# Patient Record
Sex: Female | Born: 1985
Health system: Southern US, Community
[De-identification: ages and names within clinical notes are randomized; demographics above are authoritative.]

## PROBLEM LIST (undated history)

## (undated) HISTORY — PX: BREAST BIOPSY: SHX20

---

## 2013-01-21 ENCOUNTER — Other Ambulatory Visit (HOSPITAL_COMMUNITY)
Admission: RE | Admit: 2013-01-21 | Discharge: 2013-01-21 | Disposition: A | Discharge status: Home / Self Care | Payer: BC Managed Care – PPO | Source: Ambulatory Visit | Attending: Obstetrics and Gynecology | Admitting: Obstetrics and Gynecology

## 2013-01-21 DIAGNOSIS — Z01419 Encounter for gynecological examination (general) (routine) without abnormal findings: Secondary | ICD-10-CM | POA: Insufficient documentation

## 2013-01-21 DIAGNOSIS — Z113 Encounter for screening for infections with a predominantly sexual mode of transmission: Secondary | ICD-10-CM | POA: Insufficient documentation

## 2013-01-21 DIAGNOSIS — N760 Acute vaginitis: Secondary | ICD-10-CM | POA: Insufficient documentation

## 2013-09-24 ENCOUNTER — Other Ambulatory Visit: Payer: Self-pay | Admitting: Obstetrics and Gynecology

## 2013-09-24 DIAGNOSIS — N644 Mastodynia: Secondary | ICD-10-CM

## 2013-09-24 DIAGNOSIS — N63 Unspecified lump in unspecified breast: Secondary | ICD-10-CM

## 2013-10-01 ENCOUNTER — Other Ambulatory Visit: Payer: Self-pay | Admitting: Obstetrics and Gynecology

## 2013-10-01 ENCOUNTER — Ambulatory Visit
Admission: RE | Admit: 2013-10-01 | Discharge: 2013-10-01 | Disposition: A | Discharge status: Home / Self Care | Payer: BC Managed Care – PPO | Source: Ambulatory Visit | Attending: Obstetrics and Gynecology | Admitting: Obstetrics and Gynecology

## 2013-10-01 DIAGNOSIS — N644 Mastodynia: Secondary | ICD-10-CM

## 2013-10-01 DIAGNOSIS — N63 Unspecified lump in unspecified breast: Secondary | ICD-10-CM

## 2013-11-03 ENCOUNTER — Other Ambulatory Visit: Payer: BC Managed Care – PPO

## 2013-11-05 ENCOUNTER — Other Ambulatory Visit: Payer: Self-pay | Admitting: Obstetrics and Gynecology

## 2013-11-05 ENCOUNTER — Ambulatory Visit
Admission: RE | Admit: 2013-11-05 | Discharge: 2013-11-05 | Disposition: A | Discharge status: Home / Self Care | Payer: BC Managed Care – PPO | Source: Ambulatory Visit | Attending: Obstetrics and Gynecology | Admitting: Obstetrics and Gynecology

## 2013-11-05 DIAGNOSIS — N644 Mastodynia: Secondary | ICD-10-CM

## 2013-11-05 DIAGNOSIS — N63 Unspecified lump in unspecified breast: Secondary | ICD-10-CM

## 2015-11-09 ENCOUNTER — Other Ambulatory Visit (HOSPITAL_COMMUNITY)
Admission: RE | Admit: 2015-11-09 | Discharge: 2015-11-09 | Disposition: A | Discharge status: Home / Self Care | Payer: BLUE CROSS/BLUE SHIELD | Source: Ambulatory Visit | Attending: Obstetrics and Gynecology | Admitting: Obstetrics and Gynecology

## 2015-11-09 ENCOUNTER — Other Ambulatory Visit: Payer: Self-pay | Admitting: Obstetrics and Gynecology

## 2015-11-09 DIAGNOSIS — Z01419 Encounter for gynecological examination (general) (routine) without abnormal findings: Secondary | ICD-10-CM | POA: Diagnosis present

## 2015-11-09 DIAGNOSIS — Z1151 Encounter for screening for human papillomavirus (HPV): Secondary | ICD-10-CM | POA: Diagnosis present

## 2015-11-10 ENCOUNTER — Other Ambulatory Visit: Payer: Self-pay | Admitting: Obstetrics and Gynecology

## 2015-11-10 DIAGNOSIS — N6315 Unspecified lump in the right breast, overlapping quadrants: Secondary | ICD-10-CM

## 2015-11-10 DIAGNOSIS — N6310 Unspecified lump in the right breast, unspecified quadrant: Principal | ICD-10-CM

## 2015-11-13 LAB — CYTOLOGY - PAP

## 2015-11-14 ENCOUNTER — Other Ambulatory Visit: Payer: Self-pay

## 2015-11-15 ENCOUNTER — Ambulatory Visit
Admission: RE | Admit: 2015-11-15 | Discharge: 2015-11-15 | Disposition: A | Discharge status: Home / Self Care | Payer: BLUE CROSS/BLUE SHIELD | Source: Ambulatory Visit | Attending: Obstetrics and Gynecology | Admitting: Obstetrics and Gynecology

## 2015-11-15 DIAGNOSIS — N6315 Unspecified lump in the right breast, overlapping quadrants: Secondary | ICD-10-CM

## 2015-11-15 DIAGNOSIS — N6310 Unspecified lump in the right breast, unspecified quadrant: Principal | ICD-10-CM

## 2018-10-28 ENCOUNTER — Other Ambulatory Visit: Payer: Self-pay | Admitting: Obstetrics and Gynecology

## 2018-10-28 DIAGNOSIS — Z803 Family history of malignant neoplasm of breast: Secondary | ICD-10-CM | POA: Diagnosis not present

## 2018-10-28 DIAGNOSIS — N926 Irregular menstruation, unspecified: Secondary | ICD-10-CM | POA: Diagnosis not present

## 2018-10-28 DIAGNOSIS — Z113 Encounter for screening for infections with a predominantly sexual mode of transmission: Secondary | ICD-10-CM | POA: Diagnosis not present

## 2018-10-28 DIAGNOSIS — Z01419 Encounter for gynecological examination (general) (routine) without abnormal findings: Secondary | ICD-10-CM | POA: Diagnosis not present

## 2018-10-28 DIAGNOSIS — N6320 Unspecified lump in the left breast, unspecified quadrant: Secondary | ICD-10-CM

## 2018-10-30 ENCOUNTER — Ambulatory Visit
Admission: RE | Admit: 2018-10-30 | Discharge: 2018-10-30 | Disposition: A | Discharge status: Home / Self Care | Payer: BLUE CROSS/BLUE SHIELD | Source: Ambulatory Visit | Attending: Obstetrics and Gynecology | Admitting: Obstetrics and Gynecology

## 2018-10-30 DIAGNOSIS — N6320 Unspecified lump in the left breast, unspecified quadrant: Secondary | ICD-10-CM

## 2018-10-30 DIAGNOSIS — R922 Inconclusive mammogram: Secondary | ICD-10-CM | POA: Diagnosis not present

## 2018-10-30 DIAGNOSIS — N6002 Solitary cyst of left breast: Secondary | ICD-10-CM | POA: Diagnosis not present

## 2018-12-22 DIAGNOSIS — Z23 Encounter for immunization: Secondary | ICD-10-CM | POA: Diagnosis not present

## 2019-04-23 DIAGNOSIS — L209 Atopic dermatitis, unspecified: Secondary | ICD-10-CM | POA: Diagnosis not present

## 2019-04-23 DIAGNOSIS — Z20828 Contact with and (suspected) exposure to other viral communicable diseases: Secondary | ICD-10-CM | POA: Diagnosis not present

## 2019-04-30 DIAGNOSIS — Z20828 Contact with and (suspected) exposure to other viral communicable diseases: Secondary | ICD-10-CM | POA: Diagnosis not present

## 2019-05-11 DIAGNOSIS — Z113 Encounter for screening for infections with a predominantly sexual mode of transmission: Secondary | ICD-10-CM | POA: Diagnosis not present

## 2019-05-11 DIAGNOSIS — Z124 Encounter for screening for malignant neoplasm of cervix: Secondary | ICD-10-CM | POA: Diagnosis not present

## 2022-03-07 ENCOUNTER — Other Ambulatory Visit: Payer: Self-pay | Admitting: Obstetrics and Gynecology

## 2022-03-07 DIAGNOSIS — N6342 Unspecified lump in left breast, subareolar: Secondary | ICD-10-CM

## 2022-03-15 ENCOUNTER — Ambulatory Visit
Admission: RE | Admit: 2022-03-15 | Discharge: 2022-03-15 | Disposition: A | Discharge status: Home / Self Care | Payer: BC Managed Care – PPO | Source: Ambulatory Visit | Attending: Obstetrics and Gynecology | Admitting: Obstetrics and Gynecology

## 2022-03-15 ENCOUNTER — Ambulatory Visit
Admission: RE | Admit: 2022-03-15 | Discharge: 2022-03-15 | Disposition: A | Discharge status: Home / Self Care | Payer: BLUE CROSS/BLUE SHIELD | Source: Ambulatory Visit | Attending: Obstetrics and Gynecology | Admitting: Obstetrics and Gynecology

## 2022-03-15 DIAGNOSIS — N6342 Unspecified lump in left breast, subareolar: Secondary | ICD-10-CM

## 2023-12-14 IMAGING — MG DIGITAL DIAGNOSTIC BILAT W/ TOMO W/ CAD
6 of 12 series · 6 of 36 positions shown · non-contrast
Comparison: Previous exam(s).

CLINICAL DATA: 35-year-old female with palpable thickening in
portions of the LEFT breast. History of multiple benign LEFT breast
biopsies.

EXAM:
DIGITAL DIAGNOSTIC BILATERAL MAMMOGRAM WITH TOMOSYNTHESIS AND CAD;
ULTRASOUND LEFT BREAST LIMITED
TECHNIQUE: Bilateral digital diagnostic mammography and breast tomosynthesis
was performed. The images were evaluated with computer-aided
detection.; Targeted ultrasound examination of the left breast was
performed.

[L CC synth-2D (1 of 2)]
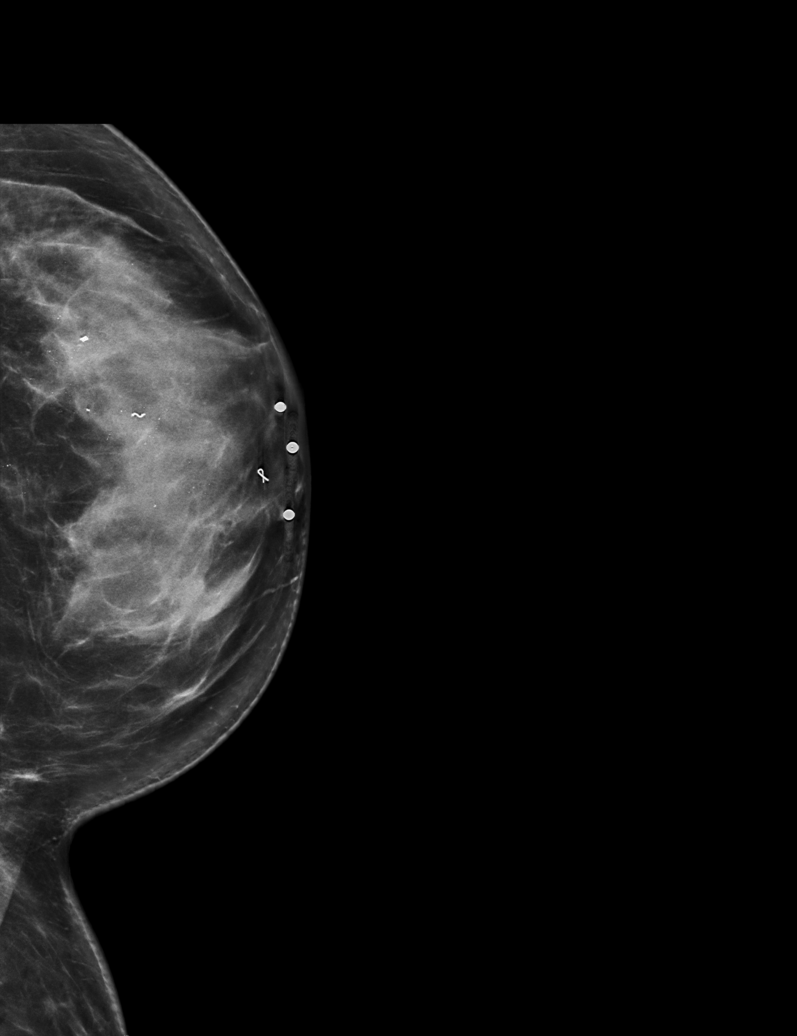

[L MLO synth-2D]
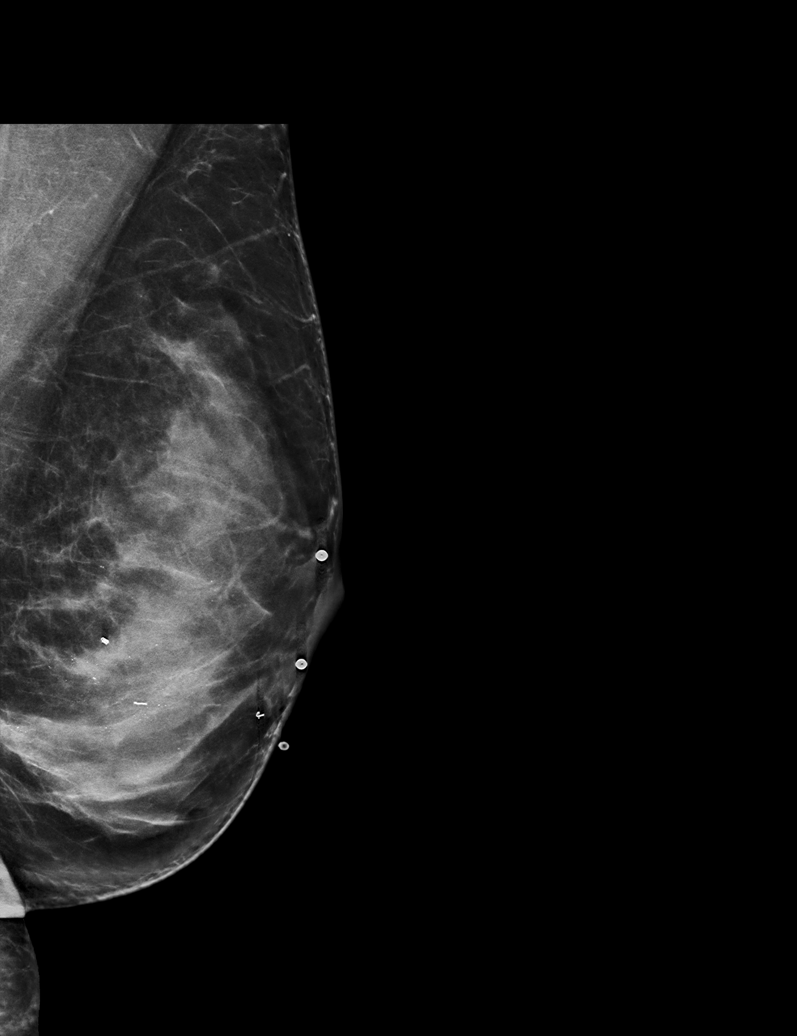

[L TAN synth-2D]
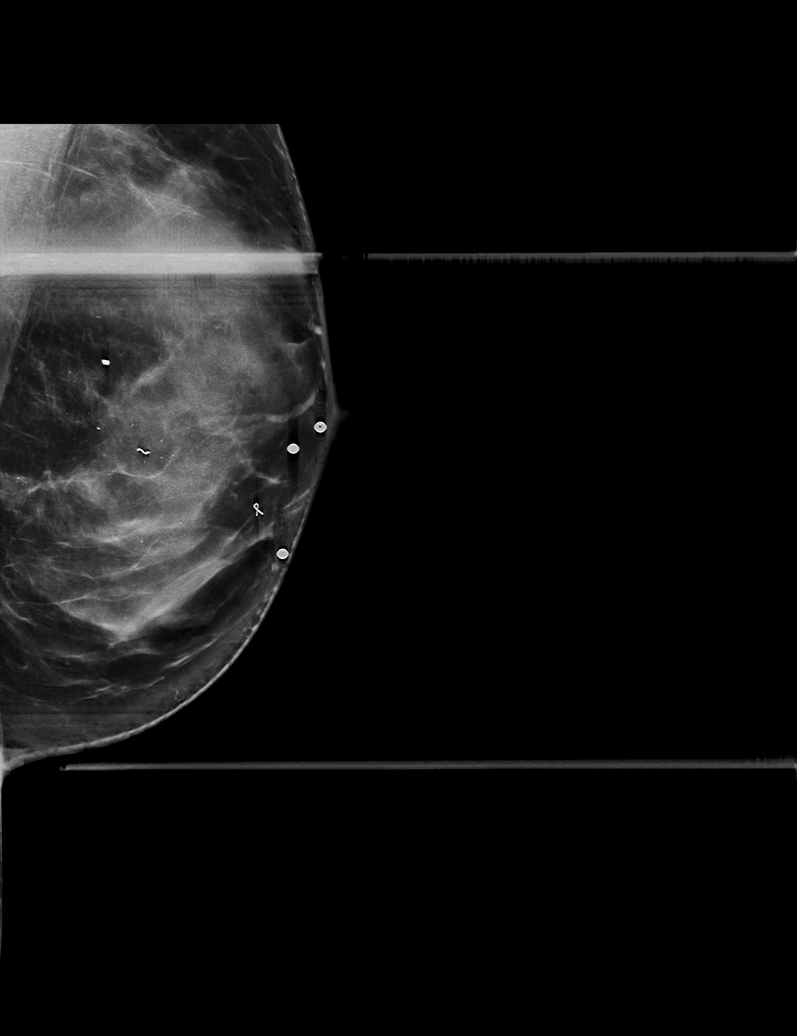

[R MLO synth-2D]
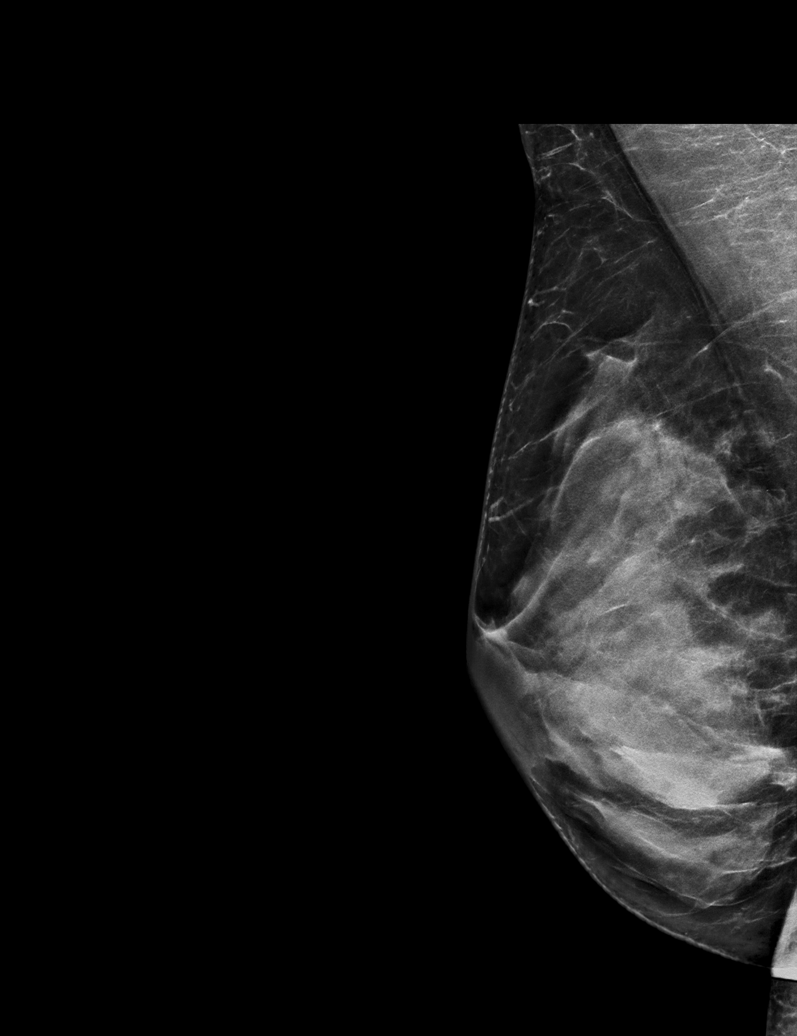

[R CC synth-2D]
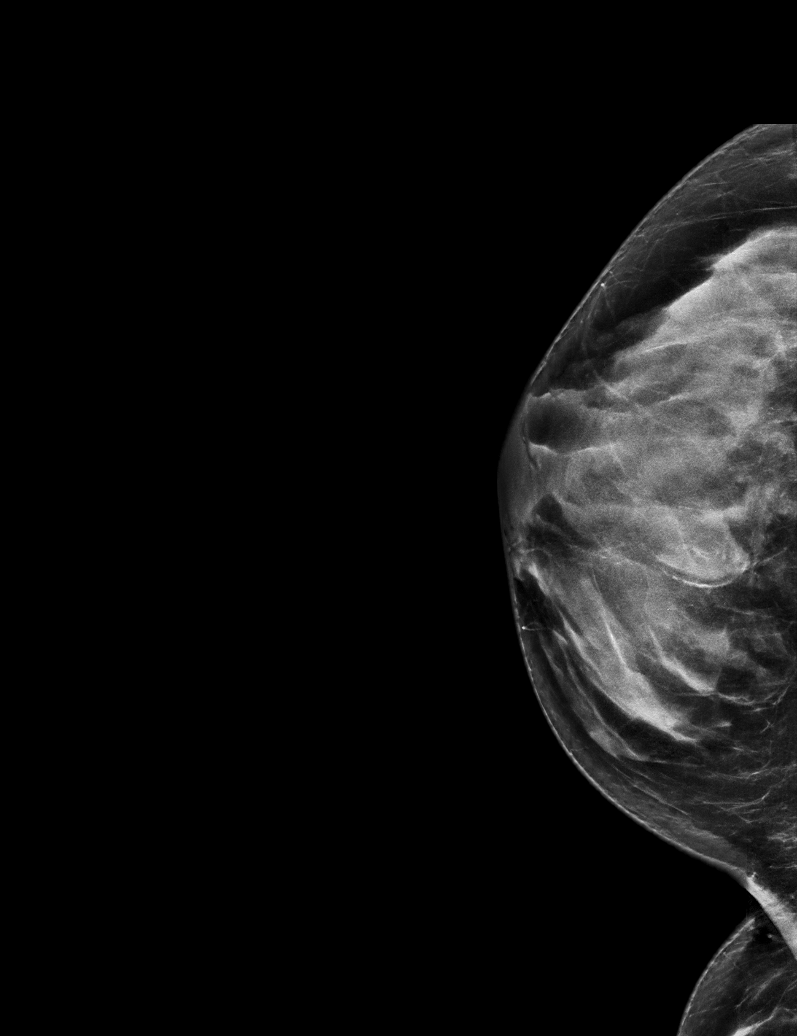

[L CC synth-2D (2 of 2)]
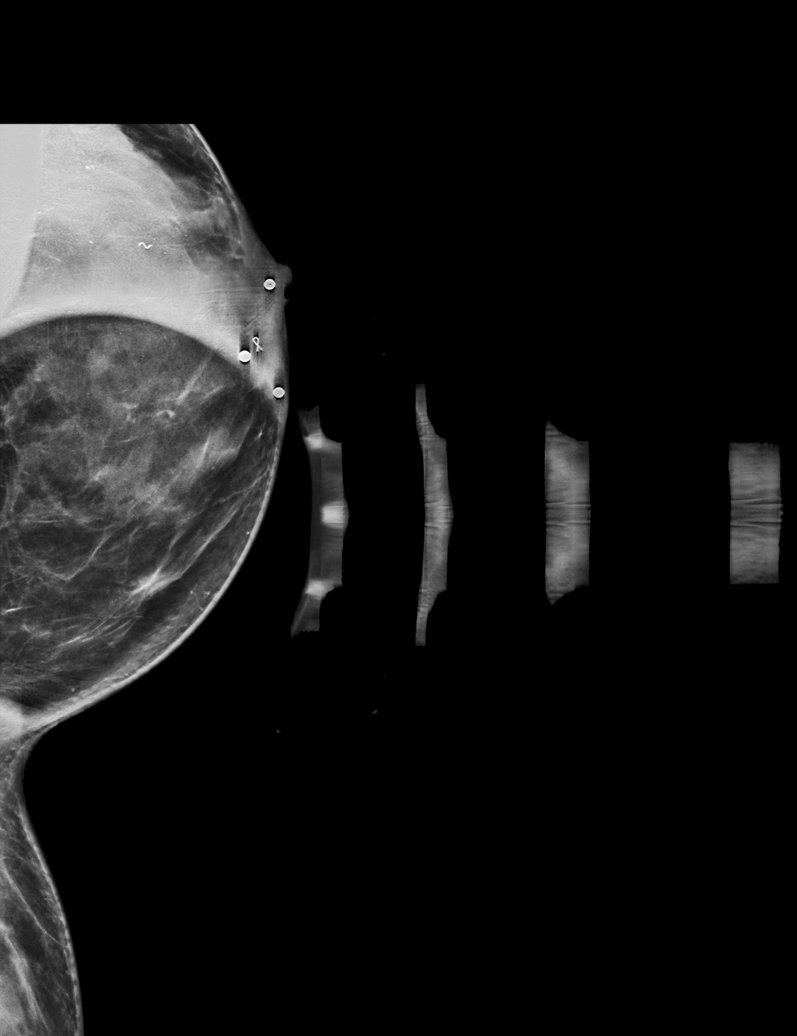

[6 of 36 positions shown; findings below may reference images not displayed]

ACR Breast Density Category d: The breast tissue is extremely dense,
which lowers the sensitivity of mammography.
FINDINGS: Full field views of both breasts and spot compression view of the
LEFT breast demonstrate no suspicious mammographic findings within
either breast.

Unchanged biopsy clips and calcifications within the LEFT breast are
again noted.

On physical exam, mild nodularity in the LEFT breast noted without
discrete palpable mass.

Targeted ultrasound is performed, showing areas of normal dense
fibroglandular tissue in the areas of patient concern. No suspicious
sonographic abnormalities are noted within the LEFT breast.

Incidental note is made of a 0.4 x 0.5 x 0.6 cm benign cyst at the 8
o'clock position of the LEFT breast 3 cm from the nipple.
IMPRESSION: 1. No mammographic, suspicious palpable or sonographic abnormalities
in the areas of patient concern in the LEFT breast.
2. No findings suspicious for breast malignancy.
3. Benign LEFT breast cyst.

RECOMMENDATION:
Bilateral screening mammogram at age 40, or earlier if clinically
indicated.

I have discussed the findings and recommendations with the patient.
If applicable, a reminder letter will be sent to the patient
regarding the next appointment.

BI-RADS CATEGORY  2: Benign.
